# Patient Record
Sex: Female | Born: 1970 | Race: White | Hispanic: No | Marital: Married | Smoking: Former smoker
Health system: Southern US, Community
[De-identification: ages and names within clinical notes are randomized; demographics above are authoritative.]

## PROBLEM LIST (undated history)

## (undated) DIAGNOSIS — I10 Essential (primary) hypertension: Secondary | ICD-10-CM

## (undated) DIAGNOSIS — J45909 Unspecified asthma, uncomplicated: Secondary | ICD-10-CM

## (undated) HISTORY — DX: Unspecified asthma, uncomplicated: J45.909

## (undated) HISTORY — DX: Essential (primary) hypertension: I10

## (undated) HISTORY — PX: FOOT SURGERY: SHX648

---

## 1998-08-03 ENCOUNTER — Ambulatory Visit (HOSPITAL_COMMUNITY): Admission: RE | Admit: 1998-08-03 | Discharge: 1998-08-03 | Payer: Self-pay | Admitting: Family Medicine

## 1998-08-03 ENCOUNTER — Encounter: Payer: Self-pay | Admitting: Family Medicine

## 1998-11-06 ENCOUNTER — Ambulatory Visit (HOSPITAL_COMMUNITY): Admission: RE | Admit: 1998-11-06 | Discharge: 1998-11-06 | Payer: Self-pay | Admitting: Family Medicine

## 1998-11-06 ENCOUNTER — Encounter: Payer: Self-pay | Admitting: Family Medicine

## 1999-11-25 ENCOUNTER — Emergency Department (HOSPITAL_COMMUNITY): Admission: EM | Admit: 1999-11-25 | Discharge: 1999-11-25 | Payer: Self-pay | Admitting: *Deleted

## 1999-11-25 ENCOUNTER — Encounter: Payer: Self-pay | Admitting: Emergency Medicine

## 1999-12-11 ENCOUNTER — Emergency Department (HOSPITAL_COMMUNITY): Admission: EM | Admit: 1999-12-11 | Discharge: 1999-12-11 | Payer: Self-pay

## 2001-02-21 ENCOUNTER — Other Ambulatory Visit: Admission: RE | Admit: 2001-02-21 | Discharge: 2001-02-21 | Payer: Self-pay | Admitting: Obstetrics & Gynecology

## 2001-08-22 ENCOUNTER — Inpatient Hospital Stay (HOSPITAL_COMMUNITY): Admission: AD | Admit: 2001-08-22 | Discharge: 2001-08-22 | Payer: Self-pay | Admitting: Obstetrics and Gynecology

## 2001-09-12 DIAGNOSIS — O321XX0 Maternal care for breech presentation, not applicable or unspecified: Secondary | ICD-10-CM

## 2001-09-23 ENCOUNTER — Inpatient Hospital Stay (HOSPITAL_COMMUNITY): Admission: AD | Admit: 2001-09-23 | Discharge: 2001-09-26 | Payer: Self-pay | Admitting: Obstetrics and Gynecology

## 2001-09-23 ENCOUNTER — Encounter (INDEPENDENT_AMBULATORY_CARE_PROVIDER_SITE_OTHER): Payer: Self-pay | Admitting: Specialist

## 2001-09-29 ENCOUNTER — Encounter: Admission: RE | Admit: 2001-09-29 | Discharge: 2001-10-29 | Payer: Self-pay | Admitting: Obstetrics and Gynecology

## 2002-05-30 ENCOUNTER — Other Ambulatory Visit: Admission: RE | Admit: 2002-05-30 | Discharge: 2002-05-30 | Payer: Self-pay | Admitting: Gynecology

## 2003-07-11 ENCOUNTER — Other Ambulatory Visit: Admission: RE | Admit: 2003-07-11 | Discharge: 2003-07-11 | Payer: Self-pay | Admitting: Obstetrics & Gynecology

## 2004-08-02 ENCOUNTER — Other Ambulatory Visit: Admission: RE | Admit: 2004-08-02 | Discharge: 2004-08-02 | Payer: Self-pay | Admitting: Obstetrics & Gynecology

## 2005-08-24 ENCOUNTER — Other Ambulatory Visit: Admission: RE | Admit: 2005-08-24 | Discharge: 2005-08-24 | Payer: Self-pay | Admitting: Obstetrics & Gynecology

## 2005-12-16 ENCOUNTER — Emergency Department (HOSPITAL_COMMUNITY): Admission: EM | Admit: 2005-12-16 | Discharge: 2005-12-16 | Payer: Self-pay | Admitting: Emergency Medicine

## 2006-04-16 ENCOUNTER — Inpatient Hospital Stay (HOSPITAL_COMMUNITY): Admission: AD | Admit: 2006-04-16 | Discharge: 2006-04-19 | Payer: Self-pay | Admitting: Obstetrics & Gynecology

## 2006-04-16 ENCOUNTER — Encounter (INDEPENDENT_AMBULATORY_CARE_PROVIDER_SITE_OTHER): Payer: Self-pay | Admitting: *Deleted

## 2006-04-20 ENCOUNTER — Encounter: Admission: RE | Admit: 2006-04-20 | Discharge: 2006-05-12 | Payer: Self-pay | Admitting: Obstetrics & Gynecology

## 2006-05-05 ENCOUNTER — Ambulatory Visit: Admission: RE | Admit: 2006-05-05 | Discharge: 2006-05-05 | Payer: Self-pay | Admitting: Obstetrics & Gynecology

## 2006-05-12 ENCOUNTER — Ambulatory Visit: Admission: RE | Admit: 2006-05-12 | Discharge: 2006-05-12 | Payer: Self-pay | Admitting: Obstetrics & Gynecology

## 2009-12-01 ENCOUNTER — Encounter: Admission: RE | Admit: 2009-12-01 | Discharge: 2009-12-01 | Payer: Self-pay | Admitting: Family Medicine

## 2010-06-30 ENCOUNTER — Encounter: Admission: RE | Admit: 2010-06-30 | Discharge: 2010-06-30 | Payer: Self-pay | Admitting: Obstetrics & Gynecology

## 2010-10-02 ENCOUNTER — Encounter: Payer: Self-pay | Admitting: Obstetrics & Gynecology

## 2011-01-05 ENCOUNTER — Other Ambulatory Visit: Payer: Self-pay | Admitting: Podiatry

## 2011-01-05 DIAGNOSIS — M79672 Pain in left foot: Secondary | ICD-10-CM

## 2011-01-06 ENCOUNTER — Ambulatory Visit
Admission: RE | Admit: 2011-01-06 | Discharge: 2011-01-06 | Disposition: A | Discharge status: Home / Self Care | Payer: 59 | Source: Ambulatory Visit | Attending: Podiatry | Admitting: Podiatry

## 2011-01-06 DIAGNOSIS — M79672 Pain in left foot: Secondary | ICD-10-CM

## 2011-01-27 ENCOUNTER — Other Ambulatory Visit: Payer: Self-pay | Admitting: Dermatology

## 2011-01-27 ENCOUNTER — Ambulatory Visit
Admission: RE | Admit: 2011-01-27 | Discharge: 2011-01-27 | Disposition: A | Discharge status: Home / Self Care | Payer: 59 | Source: Ambulatory Visit | Attending: Dermatology | Admitting: Dermatology

## 2011-01-27 DIAGNOSIS — R1012 Left upper quadrant pain: Secondary | ICD-10-CM

## 2011-01-27 MED ORDER — IOHEXOL 300 MG/ML  SOLN
100.0000 mL | Freq: Once | INTRAMUSCULAR | Status: AC | PRN
  Administered 2011-01-27: 100 mL via INTRAVENOUS

## 2011-01-28 NOTE — Discharge Summary (Signed)
[name], [name] NO.:  [id number]   MEDICAL RECORD NO.:  [id number]          PATIENT TYPE:  INP   LOCATION:  9104                          FACILITY:  [facility]   PHYSICIAN:  Dominique Davenport, M.D.   DATE OF BIRTH:  March 05, 1971   DATE OF ADMISSION:  04/16/2006  DATE OF DISCHARGE:  04/19/2006                                 DISCHARGE SUMMARY   FINAL DIAGNOSIS:  1. Intrauterine pregnancy at term.  2. History of previous cesarean section.  The patient desires repeat      cesarean section.  3. Ruptured membranes.  4. Brief trial of labor.  5. Vulvar lesion which came back as seborrheic keratosis with features of      condylomata.   PROCEDURES:  Procedure was repeat low transverse cesarean section and  removal of seborrheic keratosis.  Surgeon:  Dr. Annamaria Helling.  Complications:  None.   This 40 year old, G4 P 1-0-2-1, presents at term with rupture of membranes.  The patient had a previous cesarean section for a breech pregnancy and was  actually desirous of a vaginal birth after cesarean section at this time.  The patient's antepartum course up to this point had been uncomplicated.  She was advanced maternal age and did have a normal first trimester screen.  She was a smoker that quit with her pregnancy and had a negative group B  strep culture obtained in the office at 36 weeks.  The patient also had a  vulvar skin lesion that she wanted removed at the time of delivery.  Upon  admission to the hospital, the patient's cervix was only a fingertip  dilated, 20% effaced,  at a minus three to minus four station.  And, when  Dr. Aldona Bar evaluated the patient in lieu of her poor Bishop score, he  discussed with the patient and her husband the pros and cons of the VBAC.  She became desirous __________ cesarean section at this time and also wanted  the removal of this vulvar lesion taken care of.  She was taken to the operating room on April 16, 2006, by Dr. Annamaria Helling,  where  a repeat low transverse cesarean section was performed with the  delivery of a 7 pound 4 ounce female infant with Apgars of 9 and 9.  A short  umbilical cord was noted upon delivery but it was a three-vessel cord. After  delivery, Dr. Aldona Bar did remove the vulvar lesion which appeared to be a  seborrheic keratosis with features of a condylomata, otherwise the procedure  went without complications.  The patient's postoperative course was benign without any significant  fevers.   She was felt ready for discharge on postoperative day #3, was sent home on a  regular diet, told to decrease activity, was given Tylox, #25, one to two  every 4 hours as needed for pain, Told she could use over-the-counter Motrin  up to 600 mg every 6 hours as needed for pain, was to continue her prenatal  vitamins, was to follow up in our office in 4 weeks.   DISCHARGE LABORATORY:  The patient had a hemoglobin  of 8.8, white blood cell  count of 10.1, platelets of 232,000.      Leilani Able, P.A.-C.      Dominique Davenport, M.D.  Electronically Signed    MB/MEDQ  D:  06/01/2006  T:  06/02/2006  Job:  [id number]

## 2011-01-28 NOTE — Op Note (Signed)
[facility]  Patient:    Dominique Davenport, Dominique Davenport Visit Number: [id number] MRN: [id number]          Service Type: OBS Location: 910A 9132 01 Attending Physician:  Miguel Aschoff Dictated by:   Miguel Aschoff, M.D. Proc. Date: 09/23/01 Admit Date:  09/23/2001                             Operative Report  PREOPERATIVE DIAGNOSES:       1. Intrauterine pregnancy at 38 weeks with                                  spontaneous rupture of membranes, breech                                  presentation.                               2. Verrucous lesion of labia majora.  POSTOPERATIVE DIAGNOSES:      1. Intrauterine pregnancy at 38 weeks with                                  spontaneous rupture of membranes, breech                                  presentation.                               2. Verrucous lesion of labia majora.                               3. Delivery of viable female infant.  Apgars                                  7 and 9 from a breech presentation.  OPERATION:                    Primary low flap transverse cesarean section.  SURGEON:                      Miguel Aschoff, M.D.  ANESTHESIA:                   Spinal.  COMPLICATIONS:                None.  JUSTIFICATION:                The patient is a 40 year old white female, gravida 3, para 0-0-2-0 at 38 weeks.  The patient was scheduled for a primary cesarean section on September 27, 2001, for breech presentation, but had spontaneous rupture of membranes on the 12th.  In view of the rupture of membranes, the persistent breech presentation, she presents now to undergo primary cesarean section.  In addition, the patient reports a verrucous lesion on her left labia, and request that this lesion be excised at the time of  cesarean section.  DESCRIPTION OF PROCEDURE:     The patient was taken to the operating room and placed in the sitting position.  Spinal anesthesia was administered without difficulty.   She was then placed in the supine position, deviated to the left, and prepped and draped in the usual sterile fashion.  After this was done, a Pfannenstiel incision was made, and extended down through the subcutaneous tissue where the bleeding points were being clamped and coagulated as encountered.  The fascia was then identified and incised transversely and separated from the underlying rectus muscles.  The rectus muscles were divided in the midline.  The peritoneum was then identified and entered, carefully avoiding underlying structures.  The bladder flap was then created and protected with a bladder blade.  An elliptical transverse incision was made into the lower uterine segment.  The amniotic cavity was entered.  Clear fluid was obtained.  At this point, the patient was delivered of a viable female infant from a frank breech presentation of the sacrum anterior with Apgar score of 7 at one minute and 9 at five minutes.  After delivery of the baby, the infant was handed to the neonatal team in attendance.  Cord bloods were obtained for appropriate studies.  The placenta was then delivered and the uterus was evacuated of any remaining products of conception.  The angles of the uterine incision were then ligated using figure-of-eight sutures of #1 Vicryl.  Then, the uterus was closed in layers. The first layer was a running interlocking suture of #1 Vicryl followed by an imbricating suture of #1 Vicryl.  The bladder flap was reapproximated using a running 2-0 Vicryl suture.  At this point, the abdomen was irrigated with warm saline.  Lap counts were taken and found to be correct, and then the abdomen was closed.  The parietal peritoneum was closed using a running continuous 3-0 Vicryl suture.  The rectus muscles were reapproximated using a running continuous 0 Vicryl suture. The fascia was closed using two sutures of 0 Vicryl, starting at the lateral fascial angles and meeting in the  midline.  The subcutaneous tissue and skin were closed using staples.  Estimated blood loss was approximately 600 cc.  The patient tolerated the procedure well and went to the recovery room in satisfactory condition. Dictated by:   Miguel Aschoff, M.D. Attending Physician:  Miguel Aschoff DD:  09/23/01 TD:  09/24/01 Job: [id number]

## 2011-01-28 NOTE — Discharge Summary (Signed)
[facility]  Patient:    HAYDN, CUSH Visit Number: [id number] MRN: [id number]          Service Type: OBS Location: 910A 9132 01 Attending Physician:  Miguel Aschoff Dictated by:   Caralyn Guile. Arlyce Dice, M.D. Admit Date:  09/23/2001 Discharge Date: 09/26/2001                             Discharge Summary  FINAL DIAGNOSES:              1. Intrauterine pregnancy at 38 weeks.                               2. Breech presentation.  SECONDARY DIAGNOSES:          None.  PROCEDURES:                   Primary low transverse cesarean section.  COMPLICATIONS:                None.  CONDITION ON DISCHARGE:       Improved.  HISTORY OF PRESENT ILLNESS:   This is a 40 year old white female, gravida 3, para 0, AB 2, who was admitted at [redacted] weeks gestation with a breech presentation.  HOSPITAL COURSE:              The patient was taken to the operating room by Miguel Aschoff, M.D., where a primary low flap cesarean section was performed without complication with delivery of a 6 pound 9 ounce female infant with Apgar scores of 7 and 9.  The patients postoperative course was benign without significant fever or anemia.  On the third postoperative day, the patient was doing well and was felt to be ready for discharge.  DIET:                         She was discharged on her regular diet.  ACTIVITY:                     Told to limit her activity.  DISCHARGE MEDICATIONS:        She was given Tylox 25 tablets to take one to two every four hours for pain and asked to take her prenatal vitamins.  FOLLOW-UP:                    She was told to return to the office in four weeks for follow-up evaluation.  LABORATORY DATA:              A benign skin tag.  This was from a skin lesion that was removed by Miguel Aschoff, M.D., at the time of cesarean section from the patients buttock.  Admission hemoglobin 11.8.  Discharge hemoglobin 10.4.  White blood cell count on discharge  11.3.  A herpes culture had been done on postpartum day #1 by Loraine Leriche E. Dareen Piano, M.D., for a vulvar lesion.  The culture at the time of this dictation was in progress for seven days and was negative at that time according to the records in the hospital. Dictated by:   Caralyn Guile. Arlyce Dice, M.D. Attending Physician:  Miguel Aschoff DD:  10/22/01 TD:  10/22/01 Job: [id number]

## 2011-01-28 NOTE — Op Note (Signed)
Dominique Davenport, ARTS             ACCOUNT NO.:  [id number]   MEDICAL RECORD NO.:  [id number]          PATIENT TYPE:  INP   LOCATION:  9104                          [facility]ACILITY:  [facility]   PHYSICIAN:  Gerrit Friends. Aldona Bar, M.D.   DATE OF BIRTH:  1971-01-01   DATE OF PROCEDURE:  04/16/2006  DATE OF DISCHARGE:                                 OPERATIVE REPORT   PREOPERATIVE DIAGNOSES:  1.  Term pregnancy.  2.  Ruptured membranes.  3.  Brief trial of labor.  4.  Previous cesarean section, desire for repeat cesarean section.  5.  Vulvar lesion.   POSTOPERATIVE DIAGNOSES:  1.  Term pregnancy.  2.  Ruptured membranes.  3.  Brief trial of labor.  4.  Previous cesarean section, desire for repeat cesarean section.  5.  Vulvar lesion.  6.  Delivery of a 7 pound 4 ounce female infant, Apgars 9 and 9.  7.  Apparent short umbilical cord (pathology pending).  8.  Pathology pending on vulvar skin lesion.   PROCEDURES:  1.  Repeat low transverse cesarean section.  2.  Removal of vulvar skin lesion.   SURGEON:  Gerrit Friends. Aldona Bar, M.D.   ANESTHESIA:  Spinal.   HISTORY:  This 40 year old gravida 4, para 1, had rupture of membranes at  approximately 2 a.m. on April 16, 2006, and had a previous cesarean section  for a breech delivery and was desirous of VBAC.  My initial examination at  approximately 8 a.m. on the morning of August 5 found her cervix to be  fingertip, maybe 20% effaced, with the vertex at -3 to -4 station.  The  patient was having some contractions.  Fetal heart rate was reactive.   In view of the patient's poor Bishop score, I was somewhat pessimistic about  the success of VBAC.  After talking to the patient and her husband and  family on several occasions and because of patient frustration with her  presentation and indeed no subsequent change after an exam 2 hours later in  spite of some contractions, she became desirous of a repeat low transverse  cesarean section and is now taken to  the OR for same.  In addition, she is  desirous of having a small vulvar lesion just to the left of the clitoris  removed.  Apparently a similar lesion was removed 4 years ago when she had  her first cesarean section.   The patient was taken to the operating room, where after the satisfactory  induction of spinal anesthetic the patient was prepped and draped in the  usual fashion, having been placed in a supine position slightly tilted to  the left.  A Foley catheter was inserted as part of the prep.   Once the patient was adequate draped and good anesthetic levels were  documented, the procedure was begun.  A Pfannenstiel incision was made  slightly above the previous incision and with minimal difficulty dissected  down to and through the fascia in a low transverse fashion with hemostasis  created at each layer.  Fascia was incised a low transverse fashion.  Subfascial  space was created, midline was identified, peritoneum entered  with care taken to avoid the bowel superiorly and the bladder inferiorly.  The bladder was elevated.  Once the vesicouterine peritoneum was identified,  it was incised in a low transverse fashion, pushed off the lower uterine  segment and then sharp incision in a low transverse fashion was then made  with the Metzenbaum scissors and extended laterally with the fingers.  Thereafter with the aid of the vacuum extractor, delivery of viable 7 pound  4 ounce female infant with Apgars of 9 and 9 was carried out without  difficulty.  The infant cried spontaneously at once and after the cord was  clamped and cut, the infant was passed to the off the awaiting team and  subsequently taken to the nursery in good condition.  Cord bloods were  collected.  Placenta was thereafter delivered intact and the cord seemed to  be very short.  The placenta was requested to be sent to pathology because  of the short cord.  The patient was also a cord blood donor, and this was   handled appropriately.   The uterus was exteriorized at this point and manually stimulated and was  slowly given intravenous Pitocin.  Uterus contracted after some uterine  atony was noted.  At this time after the uterus was rendered free of any  remaining products conception, the uterine incision was closed using a  single layer of #1 Vicryl in a running locking fashion with excellent  hemostasis.  An additional figure-of-eight of #1 Vicryl was placed in the  right left angle.   Tubes and ovaries were inspected and noted to be normal.  At this time the  uterus was well-contracted.  The abdomen was lavaged of all free blood and  clot and the uterus placed into the abdominal cavity.  All counts at this  point were noted to be correct and no foreign bodies were noted to be  remaining in the abdominal cavity.  At this point after the abdomen lavaged  of all free blood and clot, closure of the abdomen was begun in layers.  The  abdominal peritoneum was closed with 0 Vicryl in a running fashion and  muscle secured with same.  Assured of good fascial hemostasis, the fascia  was then reapproximated using 0 Vicryl from angle to midline bilaterally.  Subcutaneous tissue was rendered hemostatic and staples then used to close  the skin.  Attention at this time was turned to the vulvar lesion, which was  grasped with an Allis clamp and excised in its entirety and sent to  pathology.  Two sutures of interrupted 2-0 Vicryl were used to close the  skin and, again, a pressure dressing was applied both to this area and to  the larger abdominal incision.  The patient at this point was transported to  the recovery room in satisfactory condition, having tolerated the procedure  well.  Estimated blood loss 700 mL.  All counts correct x2.  At the  conclusion of the procedure both mother and baby were doing well in their  respective recovery areas.  In summary, this patient who originally had signed on for a  VBAC presented  with a Bishop score essentially of 0 and rupture of membranes for 6 hours.  After several hours there was no change and the patient wanting what was  best to the baby, after much discussion requested delivery by repeat low  transverse cesarean section, which was carried out without  difficulty.      Gerrit Friends. Aldona Bar, M.D.  Electronically Signed     RMW/MEDQ  D:  04/16/2006  T:  04/17/2006  Job:  [id number]

## 2011-02-16 ENCOUNTER — Encounter (HOSPITAL_BASED_OUTPATIENT_CLINIC_OR_DEPARTMENT_OTHER)
Admission: RE | Admit: 2011-02-16 | Discharge: 2011-02-16 | Disposition: A | Discharge status: Home / Self Care | Payer: 59 | Source: Ambulatory Visit | Attending: Podiatry | Admitting: Podiatry

## 2011-02-16 LAB — POCT I-STAT, CHEM 8
Calcium, Ion: 1.18 mmol/L (ref 1.12–1.32)
Chloride: 106 mEq/L (ref 96–112)
Glucose, Bld: 87 mg/dL (ref 70–99)
HCT: 39 % (ref 36.0–46.0)
Hemoglobin: 13.3 g/dL (ref 12.0–15.0)
Potassium: 4 mEq/L (ref 3.5–5.1)

## 2011-02-18 ENCOUNTER — Ambulatory Visit (HOSPITAL_BASED_OUTPATIENT_CLINIC_OR_DEPARTMENT_OTHER)
Admission: RE | Admit: 2011-02-18 | Discharge: 2011-02-18 | Disposition: A | Discharge status: Home / Self Care | Payer: 59 | Source: Ambulatory Visit | Attending: Podiatry | Admitting: Podiatry

## 2011-02-18 DIAGNOSIS — X58XXXA Exposure to other specified factors, initial encounter: Secondary | ICD-10-CM | POA: Insufficient documentation

## 2011-02-18 DIAGNOSIS — M2010 Hallux valgus (acquired), unspecified foot: Secondary | ICD-10-CM | POA: Insufficient documentation

## 2011-02-18 DIAGNOSIS — Z01812 Encounter for preprocedural laboratory examination: Secondary | ICD-10-CM | POA: Insufficient documentation

## 2011-02-18 DIAGNOSIS — S92919A Unspecified fracture of unspecified toe(s), initial encounter for closed fracture: Secondary | ICD-10-CM | POA: Insufficient documentation

## 2011-02-18 LAB — POCT HEMOGLOBIN-HEMACUE: Hemoglobin: 13.3 g/dL (ref 12.0–15.0)

## 2011-11-02 ENCOUNTER — Other Ambulatory Visit: Payer: Self-pay | Admitting: Obstetrics & Gynecology

## 2014-02-17 ENCOUNTER — Encounter: Payer: Self-pay | Admitting: *Deleted

## 2014-02-17 ENCOUNTER — Other Ambulatory Visit: Payer: Self-pay | Admitting: *Deleted

## 2014-02-17 ENCOUNTER — Encounter (INDEPENDENT_AMBULATORY_CARE_PROVIDER_SITE_OTHER): Payer: 59

## 2014-02-17 DIAGNOSIS — R002 Palpitations: Secondary | ICD-10-CM

## 2014-02-17 NOTE — Progress Notes (Signed)
Patient ID: Dominique Davenport, female   DOB: 07/30/1971, 43 y.o.   MRN: [id number] E-Cardio verite 30 day cardiac event monitor applied to patient.

## 2014-07-07 ENCOUNTER — Other Ambulatory Visit: Payer: Self-pay | Admitting: Obstetrics & Gynecology

## 2014-07-08 LAB — CYTOLOGY - PAP

## 2015-02-17 ENCOUNTER — Ambulatory Visit: Payer: Self-pay | Admitting: Podiatry

## 2015-02-19 ENCOUNTER — Encounter: Payer: Self-pay | Admitting: Podiatry

## 2015-02-19 ENCOUNTER — Ambulatory Visit (INDEPENDENT_AMBULATORY_CARE_PROVIDER_SITE_OTHER): Payer: 59 | Admitting: Podiatry

## 2015-02-19 ENCOUNTER — Ambulatory Visit (INDEPENDENT_AMBULATORY_CARE_PROVIDER_SITE_OTHER): Payer: 59

## 2015-02-19 VITALS — BP 145/81 | HR 73 | Resp 16

## 2015-02-19 DIAGNOSIS — M779 Enthesopathy, unspecified: Secondary | ICD-10-CM | POA: Diagnosis not present

## 2015-02-19 DIAGNOSIS — S92912A Unspecified fracture of left toe(s), initial encounter for closed fracture: Secondary | ICD-10-CM

## 2015-02-19 DIAGNOSIS — D3610 Benign neoplasm of peripheral nerves and autonomic nervous system, unspecified: Secondary | ICD-10-CM

## 2015-02-19 NOTE — Progress Notes (Signed)
She presents today with a chief complaint of pain to her third toe left foot where she stubbed her toe approximately one week ago. She states it was black and blue and demonstrates that to me whether photographed. She's also complaining of a painful nodule to the plantar medial aspect of the right foot. Around the first metatarsophalangeal joint she has pain on ambulation with 1 particular pair of shoes. She denies any changes in her past medical history medications or allergies.  Objective: Vital signs are stable she is alert and oriented 3. Pulses are palpable right. Pulses are palpable left. Third toe does demonstrate ecchymosis and edema when radiograph does demonstrate fracture nondisplaced proximal phalanx as well as medial condyle to the distal phalanx third digit left foot. These are nondisplaced non-comminuted fractures and should go on to heal. Right foot does demonstrate plantar medial nerve first metatarsophalangeal joint right foot consistent with Joplin's neuroma.  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 I injected dexamethasone plantar medial digital nerve right. Follow-up with her in 3 weeks if necessary.

## 2015-03-17 ENCOUNTER — Ambulatory Visit: Payer: 59 | Admitting: Podiatry

## 2016-02-15 ENCOUNTER — Other Ambulatory Visit: Payer: Self-pay | Admitting: Obstetrics & Gynecology

## 2016-02-16 LAB — CYTOLOGY - PAP

## 2016-11-24 ENCOUNTER — Ambulatory Visit (INDEPENDENT_AMBULATORY_CARE_PROVIDER_SITE_OTHER): Payer: 59

## 2016-11-24 ENCOUNTER — Encounter: Payer: Self-pay | Admitting: Podiatry

## 2016-11-24 ENCOUNTER — Ambulatory Visit (INDEPENDENT_AMBULATORY_CARE_PROVIDER_SITE_OTHER): Payer: 59 | Admitting: Podiatry

## 2016-11-24 DIAGNOSIS — S92411A Displaced fracture of proximal phalanx of right great toe, initial encounter for closed fracture: Secondary | ICD-10-CM

## 2016-11-24 DIAGNOSIS — M7750 Other enthesopathy of unspecified foot: Secondary | ICD-10-CM

## 2016-11-24 DIAGNOSIS — Q828 Other specified congenital malformations of skin: Secondary | ICD-10-CM

## 2016-11-24 DIAGNOSIS — M778 Other enthesopathies, not elsewhere classified: Secondary | ICD-10-CM

## 2016-11-24 DIAGNOSIS — M779 Enthesopathy, unspecified: Secondary | ICD-10-CM

## 2016-11-24 NOTE — Progress Notes (Signed)
She presents today with a concern of her fracture to the right hallux. She states that she stepped off of a step wrong. She is also complaining of painful lesions beneath the second metatarsophalangeal joints and painful joints.  Objective: Vital signs are stable she is alert and oriented 3. Pulses are strong and palpable. She has pain on end range of motion of the second metatarsophalangeal joint bilateral and tenderness on palpation of the right hallux. Radiographs taken today do not demonstrate any fracture to the right hallux or to the lesser metatarsals. For keratomas are present on physical exam plantar aspect of the foot.  Assessment: Porokeratosis bilateral. Capsulitis second metatarsophalangeal joint. Probable capsulitis of the first metatarsophalangeal joint right.  Plan: I recommended she continue to wear the Cam Walker on the right foot. I reinjected the second metatarsophalangeal joints bilaterally. I also debrided all reactive hyperkeratosis.

## 2016-12-08 ENCOUNTER — Ambulatory Visit: Payer: 59 | Admitting: Podiatry

## 2016-12-22 ENCOUNTER — Ambulatory Visit (INDEPENDENT_AMBULATORY_CARE_PROVIDER_SITE_OTHER): Payer: 59 | Admitting: Podiatry

## 2016-12-22 DIAGNOSIS — M7750 Other enthesopathy of unspecified foot: Secondary | ICD-10-CM

## 2016-12-22 DIAGNOSIS — D3610 Benign neoplasm of peripheral nerves and autonomic nervous system, unspecified: Secondary | ICD-10-CM

## 2016-12-22 DIAGNOSIS — M779 Enthesopathy, unspecified: Principal | ICD-10-CM

## 2016-12-22 DIAGNOSIS — M778 Other enthesopathies, not elsewhere classified: Secondary | ICD-10-CM

## 2016-12-25 NOTE — Progress Notes (Signed)
She presents today for follow-up of continuous pain to the first metatarsophalangeal joint. She states that this right first metatarsophalangeal joint has been extremely painful and states that she cannot go without the Cam Walker or the Darco shoe. She states it is painful on palpation and ambulation.  Objective: No change in physical exam still swollen still painful there appears to be a palpable nerve or neuroma plantar medial aspect consistent with a Joplin's neuroma. With the entire joint itself hurts.  Assessment: Capsulitis can rule out fracture can rule out osteoarthritic changes and possible Joplin's neuroma.  Plan: Recommended MRI for surgical consideration of this right foot.

## 2017-01-18 ENCOUNTER — Inpatient Hospital Stay: Admission: RE | Admit: 2017-01-18 | Payer: 59 | Source: Ambulatory Visit

## 2017-01-31 ENCOUNTER — Other Ambulatory Visit: Payer: 59

## 2017-01-31 ENCOUNTER — Telehealth: Payer: Self-pay | Admitting: Podiatry

## 2017-01-31 NOTE — Telephone Encounter (Signed)
Request a call back in regards to the work order for her MRI as she has some insurance issues going on. Please call me on my mobile number [phone].

## 2017-01-31 NOTE — Telephone Encounter (Signed)
Patient states she had to cancel her appt at [facility] - her insurance requires her to go through scheduling with her insurance company. Patient requested to order for the MRI be emailed to her so she could send it in. I had Bethann Humble email the info to her.

## 2017-02-22 ENCOUNTER — Telehealth: Payer: Self-pay | Admitting: *Deleted

## 2017-02-22 NOTE — Telephone Encounter (Addendum)
Faxed copy of MRI orders from 12/23/2016 to [facility], [address] [place], [place] [address], [phone]/28/2017-I ordered copy of pt's MRI disc from [facility] to be mailed to the [place] [facility] office. Left message informing pt Dr. Milinda Pointer had reviewed the results of the MRI and was sending the MRI disc copy to a radiology specialist for a more indepth review for more information to treat, there would be a delay of 7-10 days and we would call with instructions once the overread results returned.03/03/2017-Mailed copy of MRI disc from [facility] to [facility].

## 2017-03-23 ENCOUNTER — Encounter: Payer: Self-pay | Admitting: Podiatry

## 2017-04-18 ENCOUNTER — Encounter: Payer: Self-pay | Admitting: Podiatry

## 2017-04-18 ENCOUNTER — Ambulatory Visit (INDEPENDENT_AMBULATORY_CARE_PROVIDER_SITE_OTHER): Payer: 59 | Admitting: Podiatry

## 2017-04-18 DIAGNOSIS — M778 Other enthesopathies, not elsewhere classified: Secondary | ICD-10-CM

## 2017-04-18 DIAGNOSIS — M779 Enthesopathy, unspecified: Principal | ICD-10-CM

## 2017-04-18 DIAGNOSIS — M7750 Other enthesopathy of unspecified foot: Secondary | ICD-10-CM

## 2017-04-19 NOTE — Progress Notes (Signed)
She presents today for follow-up of first metatarsophalangeal joint capsulitis states that seems to be doing better than it wasn't she's been wearing her Darco shoe.  Objective: She has no reproducible pain on palpation other than pain on palpation of the tibial sesamoid. She has good range of motion of the first metatarsophalangeal joint. MRI taken does not demonstrate any type of abnormality in the area.  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  Plan: She'll follow-up with me on an as-needed basis. She states that she feels that she can tolerate this without complications.

## 2018-05-08 ENCOUNTER — Other Ambulatory Visit: Payer: Self-pay | Admitting: Obstetrics & Gynecology

## 2018-05-08 DIAGNOSIS — N644 Mastodynia: Secondary | ICD-10-CM

## 2018-05-11 ENCOUNTER — Ambulatory Visit
Admission: RE | Admit: 2018-05-11 | Discharge: 2018-05-11 | Disposition: A | Discharge status: Home / Self Care | Payer: 59 | Source: Ambulatory Visit | Attending: Obstetrics & Gynecology | Admitting: Obstetrics & Gynecology

## 2018-05-11 DIAGNOSIS — N644 Mastodynia: Secondary | ICD-10-CM

## 2019-12-17 IMAGING — US ULTRASOUND LEFT BREAST LIMITED
1 series · 3 of 3 positions shown · non-contrast
Comparison: Previous exam(s).

ADDENDUM:
Recommend annual screening mammography.
CLINICAL DATA: The patient presented to her physician for breast
pain and slight skin erythema recently. She has been treated with
Keflex and is now on doxycycline. She recently had fever which has
resolved. The patient points out very slight redness today which I
cannot definitely identify. No palpable lump.

EXAM:
DIGITAL DIAGNOSTIC LEFT MAMMOGRAM WITH CAD AND TOMO
ULTRASOUND LEFT BREAST

[Series 1: ultrasound left breast limited · 0.09mm/px · 3 of 3 slices shown]
[im 1/3]
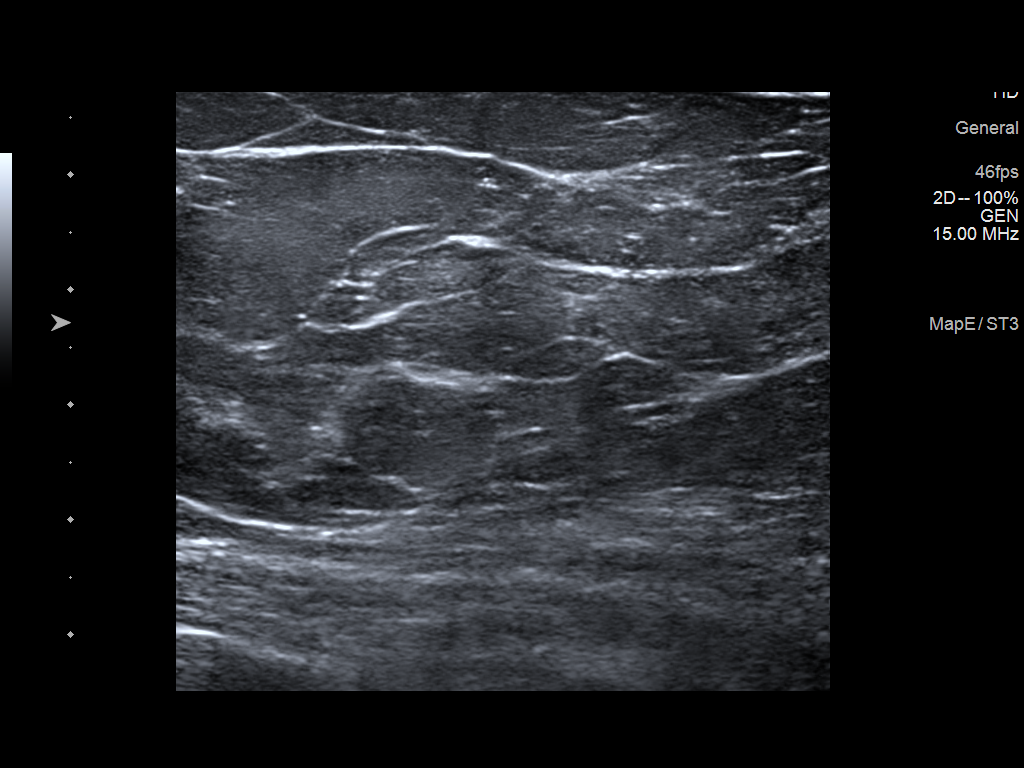
[im 2/3]
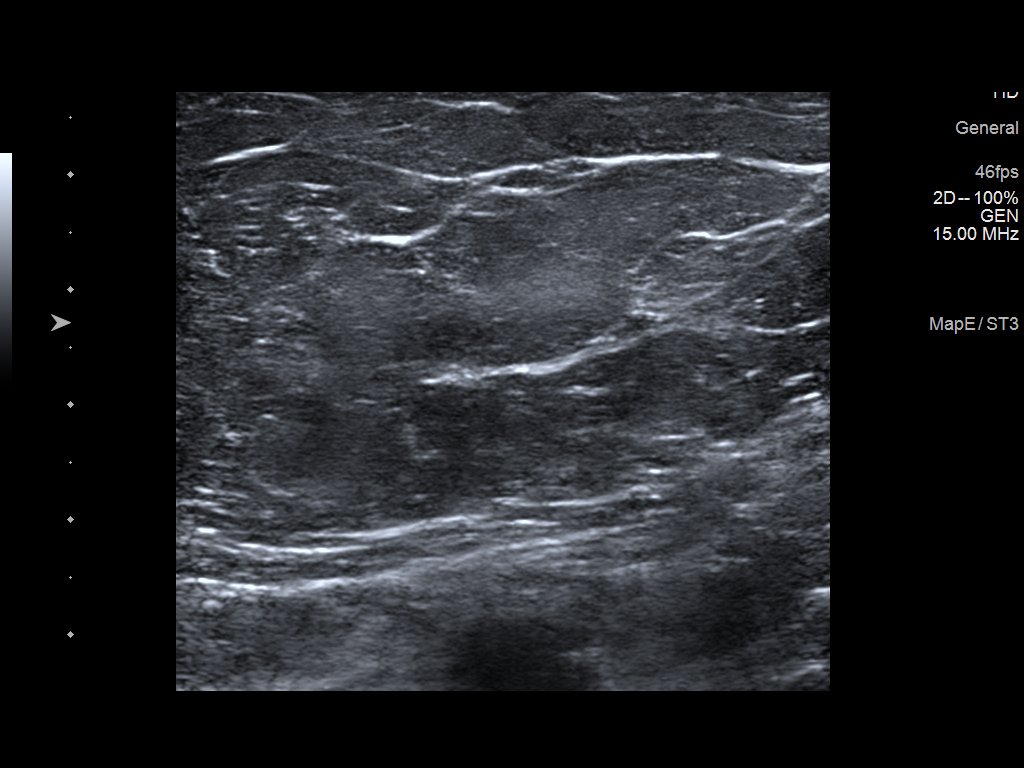
[im 3/3]
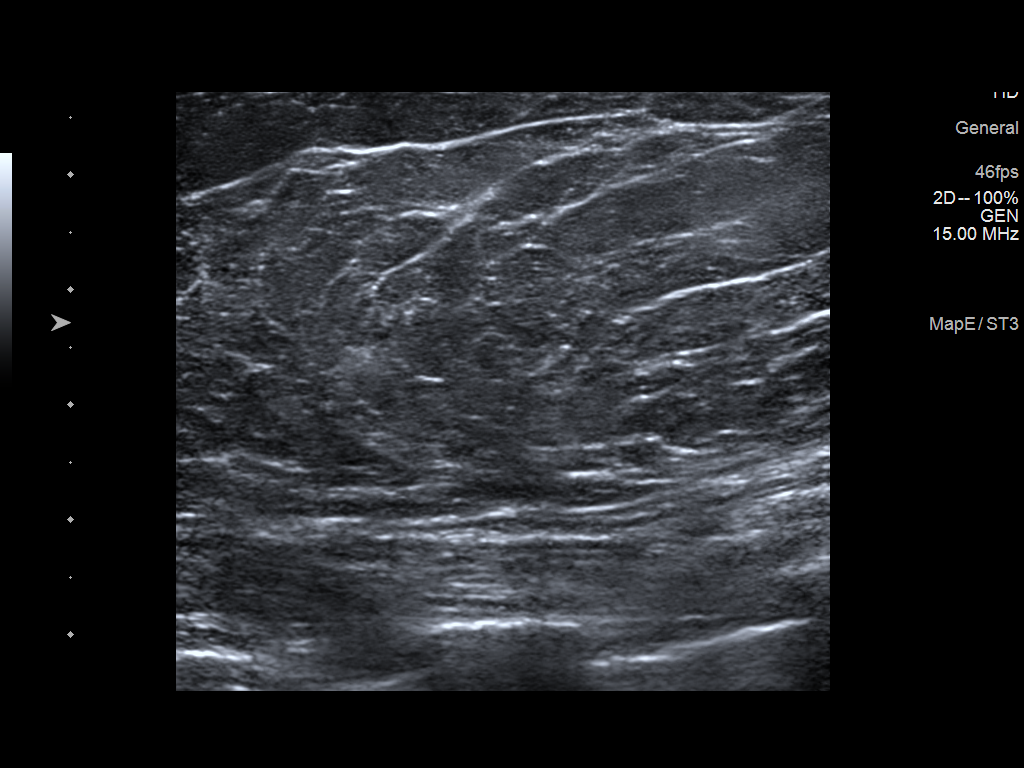

[3 of 3 positions shown; findings below may reference images not displayed]

ACR Breast Density Category b: There are scattered areas of
fibroglandular density.
FINDINGS: No suspicious masses, calcifications, distortion, or skin thickening
identified.

Mammographic images were processed with CAD.

On physical exam, no skin thickening or discrete erythema
identified.

Targeted ultrasound is performed, showing no sonographic
abnormalities.
IMPRESSION: No mammographic or sonographic evidence of malignancy.

RECOMMENDATION:
Treatment of the patient's symptoms should be based on clinical and
physical exam given lack of imaging findings. No discrete erythema
was seen today. If erythema recurs following antibiotics, recommend
surgical consultation with a punch biopsy.

I have discussed the findings and recommendations with the patient.
Results were also provided in writing at the conclusion of the
visit. If applicable, a reminder letter will be sent to the patient
regarding the next appointment.

BI-RADS CATEGORY  2: Benign.

## 2020-07-17 DIAGNOSIS — M25552 Pain in left hip: Secondary | ICD-10-CM | POA: Insufficient documentation

## 2020-07-17 DIAGNOSIS — M25562 Pain in left knee: Secondary | ICD-10-CM | POA: Insufficient documentation

## 2020-08-17 ENCOUNTER — Other Ambulatory Visit: Payer: Self-pay | Admitting: Podiatry

## 2020-08-17 ENCOUNTER — Encounter: Payer: Self-pay | Admitting: Podiatry

## 2020-08-17 ENCOUNTER — Ambulatory Visit (INDEPENDENT_AMBULATORY_CARE_PROVIDER_SITE_OTHER): Payer: 59

## 2020-08-17 ENCOUNTER — Ambulatory Visit: Payer: 59 | Admitting: Podiatry

## 2020-08-17 ENCOUNTER — Other Ambulatory Visit: Payer: Self-pay

## 2020-08-17 DIAGNOSIS — G5761 Lesion of plantar nerve, right lower limb: Secondary | ICD-10-CM

## 2020-08-17 DIAGNOSIS — G5781 Other specified mononeuropathies of right lower limb: Secondary | ICD-10-CM

## 2020-08-17 DIAGNOSIS — IMO0002 Reserved for concepts with insufficient information to code with codable children: Secondary | ICD-10-CM | POA: Insufficient documentation

## 2020-08-17 DIAGNOSIS — M778 Other enthesopathies, not elsewhere classified: Secondary | ICD-10-CM

## 2020-08-17 MED ORDER — TRIAMCINOLONE ACETONIDE 40 MG/ML IJ SUSP
20.0000 mg | Freq: Once | INTRAMUSCULAR | Status: AC
  Administered 2020-08-17: 20 mg

## 2020-08-17 NOTE — Progress Notes (Signed)
She presents today chief complaint of pain to the dorsal lateral aspect of the right foot.  States been aching for about a month or so she is concerned about a stress fracture of the right foot.  Objective: Vital signs are stable alert and oriented x3.  Pulses are palpable.  She has a painful area on palpation to the third interdigital space of the right foot consistent with neuroma.  Radiographs do not demonstrate any kind of fracture.  Assessment: Morton's neuroma third interspace right foot.  Plan: After thorough discussion we injected the area today with 20 mg Kenalog 5 mg Marcaine.  Tolerated procedure well I will follow-up with her in a few weeks.

## 2020-09-28 ENCOUNTER — Ambulatory Visit: Payer: 59 | Admitting: Podiatry

## 2020-10-07 ENCOUNTER — Ambulatory Visit (INDEPENDENT_AMBULATORY_CARE_PROVIDER_SITE_OTHER): Payer: 59 | Admitting: Podiatry

## 2020-10-07 ENCOUNTER — Other Ambulatory Visit: Payer: Self-pay

## 2020-10-07 ENCOUNTER — Encounter: Payer: Self-pay | Admitting: Podiatry

## 2020-10-07 DIAGNOSIS — G5761 Lesion of plantar nerve, right lower limb: Secondary | ICD-10-CM | POA: Diagnosis not present

## 2020-10-07 DIAGNOSIS — G5781 Other specified mononeuropathies of right lower limb: Secondary | ICD-10-CM

## 2020-10-07 MED ORDER — TRIAMCINOLONE ACETONIDE 40 MG/ML IJ SUSP
20.0000 mg | Freq: Once | INTRAMUSCULAR | Status: AC
  Administered 2020-10-07: 20 mg

## 2020-10-07 NOTE — Progress Notes (Signed)
She presents today states that her neuroma still twinges a little bit with the majority of the pain around the fifth metatarsal of the right foot but all in all is 75% better since last time she was in.  Objective: Vital signs stable alert oriented x3 she still has some tenderness on palpation of the fifth metatarsal but the majority of the tenderness is between the third and fourth metatarsal heads with a palpable Mulder's click right foot.  Assessment: Resolving neuroma 75% some lateral compensation resulting in fifth metatarsal pain.  Plan: At this point I went ahead and injected the third intermetatarsal space once again with only about 5 mg of Kenalog and 5 mg of Marcaine.  Follow-up with her in about 2 months if necessary sooner if needed.

## 2020-10-16 ENCOUNTER — Ambulatory Visit: Payer: 59 | Admitting: Neurology

## 2020-12-07 ENCOUNTER — Ambulatory Visit: Payer: 59 | Admitting: Podiatry

## 2021-12-30 DIAGNOSIS — H1013 Acute atopic conjunctivitis, bilateral: Secondary | ICD-10-CM | POA: Diagnosis not present

## 2022-02-04 DIAGNOSIS — I7300 Raynaud's syndrome without gangrene: Secondary | ICD-10-CM | POA: Diagnosis not present

## 2022-02-04 DIAGNOSIS — Z0000 Encounter for general adult medical examination without abnormal findings: Secondary | ICD-10-CM | POA: Diagnosis not present

## 2022-02-04 DIAGNOSIS — I10 Essential (primary) hypertension: Secondary | ICD-10-CM | POA: Diagnosis not present

## 2022-03-11 DIAGNOSIS — J069 Acute upper respiratory infection, unspecified: Secondary | ICD-10-CM | POA: Diagnosis not present

## 2022-03-11 DIAGNOSIS — H66001 Acute suppurative otitis media without spontaneous rupture of ear drum, right ear: Secondary | ICD-10-CM | POA: Diagnosis not present

## 2022-03-11 DIAGNOSIS — J45901 Unspecified asthma with (acute) exacerbation: Secondary | ICD-10-CM | POA: Diagnosis not present

## 2022-03-28 DIAGNOSIS — J301 Allergic rhinitis due to pollen: Secondary | ICD-10-CM | POA: Diagnosis not present

## 2022-03-28 DIAGNOSIS — J4520 Mild intermittent asthma, uncomplicated: Secondary | ICD-10-CM | POA: Diagnosis not present

## 2023-03-02 DIAGNOSIS — H903 Sensorineural hearing loss, bilateral: Secondary | ICD-10-CM | POA: Diagnosis not present

## 2023-03-02 DIAGNOSIS — H9311 Tinnitus, right ear: Secondary | ICD-10-CM | POA: Diagnosis not present

## 2023-03-03 ENCOUNTER — Other Ambulatory Visit: Payer: Self-pay | Admitting: Otolaryngology

## 2023-03-03 DIAGNOSIS — H9311 Tinnitus, right ear: Secondary | ICD-10-CM

## 2023-03-03 DIAGNOSIS — H903 Sensorineural hearing loss, bilateral: Secondary | ICD-10-CM

## 2023-03-28 DIAGNOSIS — N951 Menopausal and female climacteric states: Secondary | ICD-10-CM | POA: Diagnosis not present

## 2023-03-28 DIAGNOSIS — Z0000 Encounter for general adult medical examination without abnormal findings: Secondary | ICD-10-CM | POA: Diagnosis not present

## 2023-03-28 DIAGNOSIS — Z6833 Body mass index (BMI) 33.0-33.9, adult: Secondary | ICD-10-CM | POA: Diagnosis not present

## 2023-03-28 DIAGNOSIS — Z1231 Encounter for screening mammogram for malignant neoplasm of breast: Secondary | ICD-10-CM | POA: Diagnosis not present

## 2023-03-28 DIAGNOSIS — Z124 Encounter for screening for malignant neoplasm of cervix: Secondary | ICD-10-CM | POA: Diagnosis not present

## 2023-03-28 DIAGNOSIS — Z01419 Encounter for gynecological examination (general) (routine) without abnormal findings: Secondary | ICD-10-CM | POA: Diagnosis not present

## 2023-03-29 ENCOUNTER — Ambulatory Visit
Admission: RE | Admit: 2023-03-29 | Discharge: 2023-03-29 | Disposition: A | Discharge status: Home / Self Care | Payer: BC Managed Care – PPO | Source: Ambulatory Visit | Attending: Otolaryngology | Admitting: Otolaryngology

## 2023-03-29 DIAGNOSIS — H9311 Tinnitus, right ear: Secondary | ICD-10-CM | POA: Diagnosis not present

## 2023-03-29 DIAGNOSIS — H903 Sensorineural hearing loss, bilateral: Secondary | ICD-10-CM | POA: Diagnosis not present

## 2023-03-29 MED ORDER — GADOPICLENOL 0.5 MMOL/ML IV SOLN
8.0000 mL | Freq: Once | INTRAVENOUS | Status: AC | PRN
  Administered 2023-03-29: 8 mL via INTRAVENOUS

## 2023-04-07 DIAGNOSIS — H9311 Tinnitus, right ear: Secondary | ICD-10-CM | POA: Diagnosis not present

## 2023-04-13 DIAGNOSIS — H903 Sensorineural hearing loss, bilateral: Secondary | ICD-10-CM | POA: Diagnosis not present

## 2023-04-13 DIAGNOSIS — H9311 Tinnitus, right ear: Secondary | ICD-10-CM | POA: Diagnosis not present

## 2023-05-08 ENCOUNTER — Encounter (HOSPITAL_COMMUNITY): Payer: Self-pay | Admitting: Emergency Medicine

## 2023-05-08 ENCOUNTER — Other Ambulatory Visit: Payer: Self-pay

## 2023-05-08 ENCOUNTER — Emergency Department (HOSPITAL_COMMUNITY)
Admission: EM | Admit: 2023-05-08 | Discharge: 2023-05-09 | Disposition: A | Discharge status: Home / Self Care | Payer: BC Managed Care – PPO | Attending: Emergency Medicine | Admitting: Emergency Medicine

## 2023-05-08 DIAGNOSIS — N951 Menopausal and female climacteric states: Secondary | ICD-10-CM | POA: Diagnosis not present

## 2023-05-08 DIAGNOSIS — K429 Umbilical hernia without obstruction or gangrene: Secondary | ICD-10-CM | POA: Diagnosis not present

## 2023-05-08 DIAGNOSIS — N200 Calculus of kidney: Secondary | ICD-10-CM | POA: Diagnosis not present

## 2023-05-08 DIAGNOSIS — R1031 Right lower quadrant pain: Secondary | ICD-10-CM | POA: Diagnosis not present

## 2023-05-08 DIAGNOSIS — D72829 Elevated white blood cell count, unspecified: Secondary | ICD-10-CM | POA: Diagnosis not present

## 2023-05-08 DIAGNOSIS — N12 Tubulo-interstitial nephritis, not specified as acute or chronic: Secondary | ICD-10-CM | POA: Diagnosis not present

## 2023-05-08 DIAGNOSIS — R102 Pelvic and perineal pain: Secondary | ICD-10-CM | POA: Diagnosis not present

## 2023-05-08 NOTE — ED Triage Notes (Signed)
Patient reports suprapubic pain that started Saturday.  Patient started taking cranberry supplements and said the pain went away.  Patient states that tonight, she started having severe pain in her RLQ radiating into her side and back.  Patient denies pain with urination.

## 2023-05-09 ENCOUNTER — Emergency Department (HOSPITAL_COMMUNITY): Payer: BC Managed Care – PPO

## 2023-05-09 DIAGNOSIS — R102 Pelvic and perineal pain: Secondary | ICD-10-CM | POA: Diagnosis not present

## 2023-05-09 DIAGNOSIS — N200 Calculus of kidney: Secondary | ICD-10-CM | POA: Diagnosis not present

## 2023-05-09 DIAGNOSIS — K429 Umbilical hernia without obstruction or gangrene: Secondary | ICD-10-CM | POA: Diagnosis not present

## 2023-05-09 DIAGNOSIS — R1031 Right lower quadrant pain: Secondary | ICD-10-CM | POA: Diagnosis not present

## 2023-05-09 LAB — COMPREHENSIVE METABOLIC PANEL
ALT: 30 U/L (ref 0–44)
AST: 30 U/L (ref 15–41)
Albumin: 3.9 g/dL (ref 3.5–5.0)
Alkaline Phosphatase: 92 U/L (ref 38–126)
Anion gap: 12 (ref 5–15)
BUN: 16 mg/dL (ref 6–20)
CO2: 25 mmol/L (ref 22–32)
Calcium: 9.6 mg/dL (ref 8.9–10.3)
Chloride: 104 mmol/L (ref 98–111)
Creatinine, Ser: 1.31 mg/dL — ABNORMAL HIGH (ref 0.44–1.00)
GFR, Estimated: 49 mL/min — ABNORMAL LOW (ref >60)
Glucose, Bld: 127 mg/dL — ABNORMAL HIGH (ref 70–99)
Potassium: 4.5 mmol/L (ref 3.5–5.1)
Sodium: 141 mmol/L (ref 135–145)
Total Bilirubin: 0.5 mg/dL (ref 0.3–1.2)
Total Protein: 7.4 g/dL (ref 6.5–8.1)

## 2023-05-09 LAB — URINALYSIS, ROUTINE W REFLEX MICROSCOPIC
Bacteria, UA: NONE SEEN
Bilirubin Urine: NEGATIVE
Glucose, UA: NEGATIVE mg/dL
Ketones, ur: NEGATIVE mg/dL
Nitrite: NEGATIVE
Protein, ur: 30 mg/dL — AB
Specific Gravity, Urine: 1.008 (ref 1.005–1.030)
WBC, UA: >50 WBC/hpf (ref 0–5)
pH: 7 (ref 5.0–8.0)

## 2023-05-09 LAB — CBC WITH DIFFERENTIAL/PLATELET
Abs Immature Granulocytes: 0.13 10*3/uL — ABNORMAL HIGH (ref 0.00–0.07)
Basophils Absolute: 0.1 10*3/uL (ref 0.0–0.1)
Basophils Relative: 1 %
Eosinophils Absolute: 0.4 10*3/uL (ref 0.0–0.5)
Eosinophils Relative: 3 %
HCT: 42.4 % (ref 36.0–46.0)
Hemoglobin: 14 g/dL (ref 12.0–15.0)
Immature Granulocytes: 1 %
Lymphocytes Relative: 24 %
Lymphs Abs: 3.1 10*3/uL (ref 0.7–4.0)
MCH: 29.7 pg (ref 26.0–34.0)
MCHC: 33 g/dL (ref 30.0–36.0)
MCV: 89.8 fL (ref 80.0–100.0)
Monocytes Absolute: 1.1 10*3/uL — ABNORMAL HIGH (ref 0.1–1.0)
Monocytes Relative: 9 %
Neutro Abs: 7.8 10*3/uL — ABNORMAL HIGH (ref 1.7–7.7)
Neutrophils Relative %: 62 %
Platelets: 275 10*3/uL (ref 150–400)
RBC: 4.72 MIL/uL (ref 3.87–5.11)
RDW: 13.3 % (ref 11.5–15.5)
WBC: 12.5 10*3/uL — ABNORMAL HIGH (ref 4.0–10.5)
nRBC: 0 % (ref 0.0–0.2)

## 2023-05-09 LAB — PREGNANCY, URINE: Preg Test, Ur: NEGATIVE

## 2023-05-09 MED ORDER — HYDROCODONE-ACETAMINOPHEN 5-325 MG PO TABS: 1.0000 | ORAL_TABLET | Freq: Four times a day (QID) | ORAL | 0 refills | Status: DC | PRN

## 2023-05-09 MED ORDER — SODIUM CHLORIDE 0.9 % IV SOLN
1.0000 g | Freq: Once | INTRAVENOUS | Status: AC
  Administered 2023-05-09: 1 g via INTRAVENOUS
  Filled 2023-05-09: qty 10

## 2023-05-09 MED ORDER — ONDANSETRON HCL 4 MG/2ML IJ SOLN
4.0000 mg | Freq: Once | INTRAMUSCULAR | Status: AC
  Administered 2023-05-09: 4 mg via INTRAVENOUS
  Filled 2023-05-09: qty 2

## 2023-05-09 MED ORDER — CEPHALEXIN 500 MG PO CAPS: 500.0000 mg | ORAL_CAPSULE | Freq: Four times a day (QID) | ORAL | 0 refills | Status: DC

## 2023-05-09 MED ORDER — KETOROLAC TROMETHAMINE 15 MG/ML IJ SOLN
15.0000 mg | Freq: Once | INTRAMUSCULAR | Status: AC
  Administered 2023-05-09: 15 mg via INTRAVENOUS
  Filled 2023-05-09: qty 1

## 2023-05-09 MED ORDER — FLUCONAZOLE 150 MG PO TABS: 150.0000 mg | ORAL_TABLET | Freq: Every day | ORAL | 0 refills | Status: DC

## 2023-05-09 NOTE — ED Provider Notes (Signed)
[facility] AT [facility] Provider Note   CSN: [id number] Arrival date & time: 05/08/23  2346     History  Chief Complaint  Patient presents with   Abdominal Pain    Dominique Davenport is a 52 y.o. female.  The history is provided by the spouse and the patient.  Abdominal Pain Pain location:  RLQ Pain quality: sharp   Pain radiates to:  R flank Pain severity:  Severe Onset quality:  Gradual Duration:  5 hours Timing:  Constant Progression:  Worsening Chronicity:  New Relieved by:  Nothing Associated symptoms: nausea   Associated symptoms: no diarrhea, no fever and no hematuria   Risk factors: has not had multiple surgeries   Pt reports RLQ cramping that worsened into sharp like pain that moves into her right flank She has never had this before Only previous ABD surgeries is c-section     Home Medications Prior to Admission medications   Medication Sig Start Date End Date Taking? Authorizing Provider  carvedilol (COREG) 12.5 MG tablet Take 6.25 mg by mouth 2 (two) times daily with a meal.   Yes [provider]  cephALEXin (KEFLEX) 500 MG capsule Take 1 capsule (500 mg total) by mouth 4 (four) times daily. 05/09/23  Yes Zadie Rhine, MD  fluconazole (DIFLUCAN) 150 MG tablet Take 1 tablet (150 mg total) by mouth daily. 05/09/23  Yes Zadie Rhine, MD  HYDROcodone-acetaminophen (NORCO/VICODIN) 5-325 MG tablet Take 1 tablet by mouth every 6 (six) hours as needed for severe pain. 05/09/23  Yes Zadie Rhine, MD  levonorgestrel-ethinyl estradiol (SEASONALE) 0.15-0.03 MG tablet Take 1 tablet by mouth daily. 06/19/20  Yes [provider]  Nutritional Supplements (ADULT NUTRITIONAL SUPPLEMENT PO) Take 1 tablet by mouth daily.   Yes [provider]  oxybutynin (DITROPAN XL) 15 MG 24 hr tablet Take 15 mg by mouth daily.   Yes [provider]  Probiotic Product (PROBIOTIC PO) Take 1 capsule by mouth daily.    Yes [provider]      Allergies    Prednisone    Review of Systems   Review of Systems  Constitutional:  Negative for fever.  Gastrointestinal:  Positive for abdominal pain and nausea. Negative for diarrhea.  Genitourinary:  Negative for hematuria.    Physical Exam Updated Vital Signs BP (!) 176/113 (BP Location: Right Arm)   Pulse (!) 107   Temp 99.1 F (37.3 C)   Resp 20   Ht 1.575 m (5\' 2" )   Wt 81.6 kg   LMP  (LMP Unknown)   SpO2 98%   BMI 32.92 kg/m  Physical Exam CONSTITUTIONAL: Well developed/well nourished, anxious and uncomfortable appearing HEAD: Normocephalic/atraumatic ENMT: Mucous membranes moist NECK: supple no meningeal signs SPINE/BACK:entire spine nontender CV: S1/S2 noted, no murmurs/rubs/gallops noted LUNGS: Lungs are clear to auscultation bilaterally, no apparent distress ABDOMEN: soft, nontender, no rebound or guarding, bowel sounds noted throughout abdomen WU:[id number] cva tenderness NEURO: Pt is awake/alert/appropriate, moves all extremitiesx4.  No facial droop.   EXTREMITIES: pulses normal/equal, full ROM SKIN: warm, color normal PSYCH: anxious and tearful  ED Results / Procedures / Treatments   Labs (all labs ordered are listed, but only abnormal results are displayed) Labs Reviewed  CBC WITH DIFFERENTIAL/PLATELET - Abnormal; Notable for the following components:      Result Value   WBC 12.5 (*)    Neutro Abs 7.8 (*)    Monocytes Absolute 1.1 (*)    Abs Immature Granulocytes 0.13 (*)  All other components within normal limits  COMPREHENSIVE METABOLIC PANEL - Abnormal; Notable for the following components:   Glucose, Bld 127 (*)    Creatinine, Ser 1.31 (*)    GFR, Estimated 49 (*)    All other components within normal limits  URINALYSIS, ROUTINE W REFLEX MICROSCOPIC - Abnormal; Notable for the following components:   APPearance HAZY (*)    Hgb urine dipstick MODERATE (*)    Protein, ur 30 (*)    Leukocytes,Ua LARGE (*)     All other components within normal limits  URINE CULTURE  PREGNANCY, URINE    EKG None  Radiology CT Renal Stone Study  Result Date: 05/09/2023 CLINICAL DATA:  Suprapubic pain.  Severe right lower quadrant pain. EXAM: CT ABDOMEN AND PELVIS WITHOUT CONTRAST TECHNIQUE: Multidetector CT imaging of the abdomen and pelvis was performed following the standard protocol without IV contrast. RADIATION DOSE REDUCTION: This exam was performed according to the departmental dose-optimization program which includes automated exposure control, adjustment of the mA and/or kV according to patient size and/or use of iterative reconstruction technique. COMPARISON:  01/27/2011. FINDINGS: Lower chest: No acute abnormality. Hepatobiliary: No focal liver abnormality is seen. No gallstones, gallbladder wall thickening, or biliary dilatation. Pancreas: Unremarkable. No pancreatic ductal dilatation or surrounding inflammatory changes. Spleen: Normal in size without focal abnormality. Adrenals/Urinary Tract: No adrenal nodule or mass. There are nonobstructive renal calculi bilaterally. No ureteral calculus or obstructive uropathy bilaterally. There is mild prominence of the right ureter with periureteral fat stranding. The bladder is unremarkable. Stomach/Bowel: Stomach is within normal limits. Appendix appears normal. No evidence of bowel wall thickening, distention, or inflammatory changes. No free air or pneumatosis. Vascular/Lymphatic: No significant vascular findings are present. No enlarged abdominal or pelvic lymph nodes. Reproductive: Uterus and bilateral adnexa are unremarkable. Other: No abdominopelvic ascites. Fat containing umbilical hernia is present. Musculoskeletal: Mild degenerative changes are noted in the thoracolumbar spine. No acute osseous abnormality. IMPRESSION: 1. Mild prominence of the right ureter with periureteral fat stranding which may be infectious or inflammatory. No obstructive uropathy bilaterally.  2. Bilateral nephrolithiasis. Electronically Signed   By: Thornell Sartorius M.D.   On: 05/09/2023 00:52    Procedures Procedures    Medications Ordered in ED Medications  ketorolac (TORADOL) 15 MG/ML injection 15 mg (15 mg Intravenous Given 05/09/23 0103)  ondansetron (ZOFRAN) injection 4 mg (4 mg Intravenous Given 05/09/23 0105)  cefTRIAXone (ROCEPHIN) 1 g in sodium chloride 0.9 % 100 mL IVPB (0 g Intravenous Stopped 05/09/23 0142)    ED Course/ Medical Decision Making/ A&P Clinical Course as of 05/09/23 0154  Tue May 09, 2023  0025 Patient presents with right flank and abdominal pain and appears very uncomfortable.  Strong suspicion for ureteral stone and hydronephrosis.  Urine also consistent with infection, will give IV Rocephin [DW]  0045 Creatinine(!): 1.31 Mild renal insufficiency [DW]  0045 WBC(!): 12.5 leukocytosis [DW]  0153 Overall patient is feeling improved.  No obvious ureteral stones, though she may have had a recently passed kidney stone  Urine and CT is consistent with infection.  However patient is not septic appearing is overall improved, she is safe for outpatient management.  Urine culture sent and she was loaded with Rocephin.  She be placed on Keflex 4 times daily for a week.  Discussed at length strict ER return precautions with patient and her spouse [DW]    Clinical Course User Index [DW] Zadie Rhine, MD  Medical Decision Making Amount and/or Complexity of Data Reviewed Labs: ordered. Decision-making details documented in ED Course. Radiology: ordered.  Risk Prescription drug management.   This patient presents to the ED for concern of abdominal pain/flank pain, this involves an extensive number of treatment options, and is a complaint that carries with it a high risk of complications and morbidity.  The differential diagnosis includes but is not limited to cholecystitis, cholelithiasis, pancreatitis, gastritis, peptic  ulcer disease, appendicitis, bowel obstruction, bowel perforation, diverticulitis, AAA, ischemic bowel, UTI, ureteral stone, ectopic pregnancy    Additional history obtained: Additional history obtained from spouse Records reviewed  outpatient records reviewed  Lab Tests: I Ordered, and personally interpreted labs.  The pertinent results include:  UTI, hematuria, leukocytosis  Imaging Studies ordered: I ordered imaging studies including CT scan renal   I independently visualized and interpreted imaging which showed pyelonephritis I agree with the radiologist interpretation  Medicines ordered and prescription drug management: I ordered medication including IV toradol  for pain  Reevaluation of the patient after these medicines showed that the patient    improved   Critical Interventions:   IV antibiotics  Reevaluation: After the interventions noted above, I reevaluated the patient and found that they have :improved  Complexity of problems addressed: Patient's presentation is most consistent with  acute presentation with potential threat to life or bodily function  Disposition: After consideration of the diagnostic results and the patient's response to treatment,  I feel that the patent would benefit from discharge   .           Final Clinical Impression(s) / ED Diagnoses Final diagnoses:  Pyelonephritis    Rx / DC Orders ED Discharge Orders          Ordered    cephALEXin (KEFLEX) 500 MG capsule  4 times daily        05/09/23 0152    fluconazole (DIFLUCAN) 150 MG tablet  Daily        05/09/23 0152    HYDROcodone-acetaminophen (NORCO/VICODIN) 5-325 MG tablet  Every 6 hours PRN        05/09/23 0152              Zadie Rhine, MD 05/09/23 0154

## 2023-05-11 LAB — URINE CULTURE: Culture: >=100000 — AB

## 2023-05-12 ENCOUNTER — Telehealth (HOSPITAL_BASED_OUTPATIENT_CLINIC_OR_DEPARTMENT_OTHER): Payer: Self-pay

## 2023-05-12 NOTE — Telephone Encounter (Signed)
Post ED Visit - Positive Culture Follow-up: Unsuccessful Patient Follow-up  Culture assessed and recommendations reviewed by:  [x]  Ivery Quale, Pharm.D. []  Celedonio Miyamoto, Pharm.D., BCPS AQ-ID []  Garvin Fila, Pharm.D., BCPS []  Georgina Pillion, Pharm.D., BCPS []  [place], [address].D., BCPS, AAHIVP []  Estella Husk, Pharm.D., BCPS, AAHIVP []  Sherlynn Carbon, PharmD []  Pollyann Samples, PharmD, BCPS  Positive urien culture  [x]  Patient discharged without antimicrobial prescription and treatment is now indicated []  Organism is resistant to prescribed ED discharge antimicrobial []  Patient with positive blood cultures  Plan: Change abx to Ciprofloxacin 500 mg po BID x 5 days per ED provider Kristine Royal, MD   Unable to contact patient after 3 attempts, letter will be sent to address on file  Sandria Senter 05/12/2023, 2:15 PM

## 2023-05-12 NOTE — Progress Notes (Signed)
ED Antimicrobial Stewardship Positive Culture Follow Up   Dominique Davenport is an 52 y.o. female who presented to [facility] on 05/08/2023 with a chief complaint of  Chief Complaint  Patient presents with   Abdominal Pain    Recent Results (from the past 720 hour(s))  Urine Culture     Status: Abnormal   Collection Time: 05/09/23 12:20 AM   Specimen: Urine, Clean Catch  Result Value Ref Range Status   Specimen Description URINE, CLEAN CATCH  Final   Special Requests   Final    NONE Performed at [facility], [address] [place], [place] [address]    Culture >=100,000 COLONIES/mL STAPHYLOCOCCUS SAPROPHYTICUS (A)  Final   Report Status 05/11/2023 FINAL  Final   Organism ID, Bacteria STAPHYLOCOCCUS SAPROPHYTICUS (A)  Final      Susceptibility   Staphylococcus saprophyticus - MIC*    CIPROFLOXACIN <=0.5 SENSITIVE Sensitive     GENTAMICIN <=0.5 SENSITIVE Sensitive     NITROFURANTOIN <=16 SENSITIVE Sensitive     OXACILLIN >=4 RESISTANT Resistant     TETRACYCLINE <=1 SENSITIVE Sensitive     VANCOMYCIN 1 SENSITIVE Sensitive     TRIMETH/SULFA <=10 SENSITIVE Sensitive     CLINDAMYCIN <=0.25 SENSITIVE Sensitive     RIFAMPIN <=0.5 SENSITIVE Sensitive     Inducible Clindamycin NEGATIVE Sensitive     * >=100,000 COLONIES/mL STAPHYLOCOCCUS SAPROPHYTICUS    [x]  Treated with keflex, organism resistant to prescribed antimicrobial []  Patient discharged originally without antimicrobial agent and treatment is now indicated  New antibiotic prescription: ciprofloxacin 500mg  BID x 5 days  ED Provider: Kristine Royal, MD    Marja Kays 05/12/2023, 8:35 AM Clinical Pharmacist [phone]

## 2023-05-23 DIAGNOSIS — R399 Unspecified symptoms and signs involving the genitourinary system: Secondary | ICD-10-CM | POA: Diagnosis not present

## 2023-05-25 ENCOUNTER — Telehealth (HOSPITAL_BASED_OUTPATIENT_CLINIC_OR_DEPARTMENT_OTHER): Payer: Self-pay | Admitting: *Deleted

## 2023-05-25 NOTE — Telephone Encounter (Signed)
Received phone call from pt in response to letter sent to address on file.  States she is continuing to have UTI symptoms.  Ciprofloxacin 500mg  PO BID x 5 days call to [facility], [facility] [phone]

## 2023-06-05 DIAGNOSIS — N390 Urinary tract infection, site not specified: Secondary | ICD-10-CM | POA: Diagnosis not present

## 2023-06-05 DIAGNOSIS — R309 Painful micturition, unspecified: Secondary | ICD-10-CM | POA: Diagnosis not present

## 2023-06-05 DIAGNOSIS — R1030 Lower abdominal pain, unspecified: Secondary | ICD-10-CM | POA: Diagnosis not present

## 2023-06-13 DIAGNOSIS — M549 Dorsalgia, unspecified: Secondary | ICD-10-CM | POA: Insufficient documentation

## 2023-10-09 DIAGNOSIS — R52 Pain, unspecified: Secondary | ICD-10-CM | POA: Diagnosis not present

## 2023-10-09 DIAGNOSIS — R059 Cough, unspecified: Secondary | ICD-10-CM | POA: Diagnosis not present

## 2023-10-09 DIAGNOSIS — R509 Fever, unspecified: Secondary | ICD-10-CM | POA: Diagnosis not present

## 2023-10-09 DIAGNOSIS — J189 Pneumonia, unspecified organism: Secondary | ICD-10-CM | POA: Diagnosis not present

## 2023-10-09 DIAGNOSIS — J101 Influenza due to other identified influenza virus with other respiratory manifestations: Secondary | ICD-10-CM | POA: Diagnosis not present

## 2023-10-13 DIAGNOSIS — J189 Pneumonia, unspecified organism: Secondary | ICD-10-CM | POA: Diagnosis not present

## 2023-12-21 DIAGNOSIS — N950 Postmenopausal bleeding: Secondary | ICD-10-CM | POA: Diagnosis not present

## 2023-12-25 DIAGNOSIS — N950 Postmenopausal bleeding: Secondary | ICD-10-CM | POA: Diagnosis not present

## 2024-02-28 ENCOUNTER — Encounter: Payer: Self-pay | Admitting: Podiatry

## 2024-02-28 ENCOUNTER — Ambulatory Visit: Admitting: Podiatry

## 2024-02-28 ENCOUNTER — Other Ambulatory Visit: Payer: Self-pay | Admitting: Podiatry

## 2024-02-28 ENCOUNTER — Ambulatory Visit (INDEPENDENT_AMBULATORY_CARE_PROVIDER_SITE_OTHER)

## 2024-02-28 DIAGNOSIS — M7751 Other enthesopathy of right foot: Secondary | ICD-10-CM

## 2024-02-28 DIAGNOSIS — S92425A Nondisplaced fracture of distal phalanx of left great toe, initial encounter for closed fracture: Secondary | ICD-10-CM

## 2024-02-28 DIAGNOSIS — S90112A Contusion of left great toe without damage to nail, initial encounter: Secondary | ICD-10-CM | POA: Diagnosis not present

## 2024-02-28 DIAGNOSIS — G5781 Other specified mononeuropathies of right lower limb: Secondary | ICD-10-CM

## 2024-02-28 MED ORDER — TRIAMCINOLONE ACETONIDE 40 MG/ML IJ SUSP
20.0000 mg | Freq: Once | INTRAMUSCULAR | Status: AC
Start: 2024-02-28 — End: 2024-02-28
  Administered 2024-02-28: 20 mg

## 2024-02-28 NOTE — Progress Notes (Signed)
  Subjective:  Patient ID: Dominique Davenport, female    DOB: 14-Jun-1971,  MRN: [id number] HPI Chief Complaint  Patient presents with   Foot Pain    Lateral/forefoot right - aching x few months, no injury   Toe Injury    Hallux left - injury on Saturday (02/24/24), bruised, swollen tip of toe, some pain forefoot   New Patient (Initial Visit)    Est pt 2022    53 y.o. female presents with the above complaint.   ROS: Denies fever chills nausea vomit muscle aches pains calf pain back pain chest pain shortness of breath.  No past medical history on file. No past surgical history on file.  Current Outpatient Medications:    carvedilol (COREG) 12.5 MG tablet, Take 6.25 mg by mouth 2 (two) times daily with a meal., Disp: , Rfl:    levonorgestrel-ethinyl estradiol (SEASONALE) 0.15-0.03 MG tablet, Take 1 tablet by mouth daily., Disp: , Rfl:    Nutritional Supplements (ADULT NUTRITIONAL SUPPLEMENT PO), Take 1 tablet by mouth daily., Disp: , Rfl:    oxybutynin (DITROPAN XL) 15 MG 24 hr tablet, Take 15 mg by mouth daily., Disp: , Rfl:    Probiotic Product (PROBIOTIC PO), Take 1 capsule by mouth daily., Disp: , Rfl:   Allergies  Allergen Reactions   Prednisone     Other reaction(s): Other, Other (See Comments) Decreased immune system    Review of Systems Objective:  There were no vitals filed for this visit.  General: Well developed, nourished, in no acute distress, alert and oriented x3   Dermatological: Skin is warm, dry and supple bilateral. Nails x 10 are well maintained; remaining integument appears unremarkable at this time. There are no open sores, no preulcerative lesions, no rash or signs of infection present.  Vascular: Dorsalis Pedis artery and Posterior Tibial artery pedal pulses are 2/4 bilateral with immedate capillary fill time. Pedal hair growth present. No varicosities and no lower extremity edema present bilateral.   Neruologic: Grossly intact via light touch bilateral.  Vibratory intact via tuning fork bilateral. Protective threshold with Semmes Wienstein monofilament intact to all pedal sites bilateral. Patellar and Achilles deep tendon reflexes 2+ bilateral. No Babinski or clonus noted bilateral.  Palpable Mulder's click third interdigital space of the right foot.  Musculoskeletal: No gross boney pedal deformities bilateral. No pain, crepitus, or limitation noted with foot and ankle range of motion bilateral. Muscular strength 5/5 in all groups tested bilateral.  Swollen ecchymotic hallux left.  Tender on range of motion of the interphalangeal joint of the hallux.  Gait: Unassisted, Nonantalgic.    Radiographs:  Radiographs of the left foot taken today demonstrate osseously mature individual with good bone mineralization.  It appears that she has a transverse fracture just deep to the subchondral bone of the distal phalanx.  Does not appear to be comminuted or displaced at this point.  Right foot demonstrates a retained screw first metatarsal secondary to an osteotomy and correction.  No other osseous abnormalities.  Assessment & Plan:   Assessment: Fracture hallux left distal phalanx transverse subchondral bone.  Neuroma third interdigital space right.  Plan: Discussed etiology pathology conservative surgical therapies recommended Darco shoe for the left foot and the fracture of the hallux.  I injected the third indurative space today 20 mg Kenalog , Marcaine plain maximal tenderness.  Tolerated procedure well without complications.  Follow-up with her in the near future.     Sandralee Tarkington T. Call[place]han, [place]

## 2024-03-26 DIAGNOSIS — N950 Postmenopausal bleeding: Secondary | ICD-10-CM | POA: Diagnosis not present

## 2024-03-27 ENCOUNTER — Ambulatory Visit (INDEPENDENT_AMBULATORY_CARE_PROVIDER_SITE_OTHER)

## 2024-03-27 ENCOUNTER — Ambulatory Visit: Admitting: Podiatry

## 2024-03-27 DIAGNOSIS — S92425A Nondisplaced fracture of distal phalanx of left great toe, initial encounter for closed fracture: Secondary | ICD-10-CM

## 2024-03-27 DIAGNOSIS — G5781 Other specified mononeuropathies of right lower limb: Secondary | ICD-10-CM

## 2024-03-27 DIAGNOSIS — S92425D Nondisplaced fracture of distal phalanx of left great toe, subsequent encounter for fracture with routine healing: Secondary | ICD-10-CM

## 2024-03-27 NOTE — Progress Notes (Signed)
 She presents today for a follow-up of her fracture hallux left.  She states that it is still tender continues to wear her Darco shoe.  States that it is feeling some better.  She states that the neuroma in the right foot is feeling much better  Objective: 4-week follow-up fracture base distal phalanx subchondral bone nondisplaced left foot.  Moderately tender on palpation much decrease in edema.  Tender on range of motion at the interphalangeal joint.  No pain on palpation neuroma third interdigital space right foot  Radiographs taken today demonstrate healing fracture base of distal phalanx interphalangeal joint of the left hallux  Assessment: Well-healing fracture.  Resolving neuroma third interspace right foot  Plan: Continue the use of the Darco shoe for another 4 weeks.  We did discuss long-term effects of this type of fracture floating arthritis and chronic discomfort.

## 2024-03-28 NOTE — Addendum Note (Signed)
 Addended by: ELAYNE ROSINA BRAVO on: 03/28/2024 07:54 PM   Modules accepted: Level of Service

## 2024-04-02 DIAGNOSIS — N3941 Urge incontinence: Secondary | ICD-10-CM | POA: Diagnosis not present

## 2024-04-02 DIAGNOSIS — N760 Acute vaginitis: Secondary | ICD-10-CM | POA: Diagnosis not present

## 2024-04-02 DIAGNOSIS — N950 Postmenopausal bleeding: Secondary | ICD-10-CM | POA: Diagnosis not present

## 2024-04-02 DIAGNOSIS — Z1231 Encounter for screening mammogram for malignant neoplasm of breast: Secondary | ICD-10-CM | POA: Diagnosis not present

## 2024-04-02 DIAGNOSIS — Z01411 Encounter for gynecological examination (general) (routine) with abnormal findings: Secondary | ICD-10-CM | POA: Diagnosis not present

## 2024-05-17 DIAGNOSIS — H8109 Meniere's disease, unspecified ear: Secondary | ICD-10-CM | POA: Diagnosis not present

## 2024-05-17 DIAGNOSIS — H9313 Tinnitus, bilateral: Secondary | ICD-10-CM | POA: Diagnosis not present

## 2024-05-17 DIAGNOSIS — H903 Sensorineural hearing loss, bilateral: Secondary | ICD-10-CM | POA: Diagnosis not present

## 2024-05-20 DIAGNOSIS — R202 Paresthesia of skin: Secondary | ICD-10-CM | POA: Diagnosis not present

## 2024-06-26 DIAGNOSIS — M25552 Pain in left hip: Secondary | ICD-10-CM | POA: Diagnosis not present

## 2024-06-26 DIAGNOSIS — M47816 Spondylosis without myelopathy or radiculopathy, lumbar region: Secondary | ICD-10-CM | POA: Diagnosis not present

## 2024-06-26 DIAGNOSIS — M549 Dorsalgia, unspecified: Secondary | ICD-10-CM | POA: Diagnosis not present

## 2024-07-01 ENCOUNTER — Encounter (HOSPITAL_BASED_OUTPATIENT_CLINIC_OR_DEPARTMENT_OTHER): Payer: Self-pay | Admitting: Obstetrics & Gynecology

## 2024-07-01 DIAGNOSIS — M546 Pain in thoracic spine: Secondary | ICD-10-CM | POA: Diagnosis not present

## 2024-07-01 DIAGNOSIS — M25552 Pain in left hip: Secondary | ICD-10-CM | POA: Diagnosis not present

## 2024-07-01 DIAGNOSIS — R293 Abnormal posture: Secondary | ICD-10-CM | POA: Diagnosis not present

## 2024-07-04 ENCOUNTER — Ambulatory Visit (HOSPITAL_BASED_OUTPATIENT_CLINIC_OR_DEPARTMENT_OTHER): Admitting: Obstetrics & Gynecology

## 2024-07-04 ENCOUNTER — Other Ambulatory Visit (HOSPITAL_COMMUNITY)
Admission: RE | Admit: 2024-07-04 | Discharge: 2024-07-04 | Disposition: A | Discharge status: Home / Self Care | Source: Ambulatory Visit | Attending: Obstetrics & Gynecology | Admitting: Obstetrics & Gynecology

## 2024-07-04 ENCOUNTER — Encounter (HOSPITAL_BASED_OUTPATIENT_CLINIC_OR_DEPARTMENT_OTHER): Payer: Self-pay | Admitting: Obstetrics & Gynecology

## 2024-07-04 VITALS — BP 152/72 | HR 61 | Ht 62.0 in | Wt 188.6 lb

## 2024-07-04 DIAGNOSIS — N898 Other specified noninflammatory disorders of vagina: Secondary | ICD-10-CM | POA: Diagnosis not present

## 2024-07-04 DIAGNOSIS — Z1331 Encounter for screening for depression: Secondary | ICD-10-CM | POA: Diagnosis not present

## 2024-07-04 DIAGNOSIS — Z1211 Encounter for screening for malignant neoplasm of colon: Secondary | ICD-10-CM

## 2024-07-04 DIAGNOSIS — N941 Unspecified dyspareunia: Secondary | ICD-10-CM | POA: Diagnosis not present

## 2024-07-04 MED ORDER — ESTRADIOL 0.01 % VA CREA
TOPICAL_CREAM | VAGINAL | 1 refills | Status: AC
Start: 2024-07-04 — End: ?

## 2024-07-04 NOTE — Progress Notes (Unsigned)
 ANNUAL EXAM Patient name: Dominique Davenport MRN [id number]  Date of birth: 11-Aug-1971 Chief Complaint:   Annual Exam  History of Present Illness:   Dominique Davenport is a 53 y.o. [phone] Caucasian female being seen today for new patient appointment.  Has been seen by another ob/gyn in [place] for several years.  Has been advised she is in menopause with blood work that confirmed this finding.  However, had two episodes of bleeding this past year, like menstrual cycles.  For evaluation, underwent ultrasound and endometrial biopsy.  86 pages of records scanned into EPIC and are under the media tab today.  Prior pap smear and endometrial biopsy found in these records.  Biopsy don e 12/27/2023 showed atrophic changes.  Since biopsy, she's been experiencing dyspareunia especially with insertion.  Shared concerns with prior ob/gyn and advised she had vaginitis contributing . Treatment did not help.  No recent vaginal bleeding.    Patient's last menstrual period was 04/08/2024 (approximate).  Last pap 03/2024. Results were: NILM w/ HRHPV negative. H/O abnormal pap: no Last mammogram: 04/02/2024. Results were: normal. Family h/o breast cancer: yes grandmother breast cancer noted at her death at age 20 Last colonoscopy: never     07/04/2024    2:23 PM  Depression screen PHQ 2/9  Decreased Interest 1  Down, Depressed, Hopeless 0  PHQ - 2 Score 1        07/04/2024    2:23 PM  GAD 7 : Generalized Anxiety Score  Nervous, Anxious, on Edge 0  Control/stop worrying 0  Worry too much - different things 0  Trouble relaxing 1  Restless 0  Easily annoyed or irritable 0  Afraid - awful might happen 1  Total GAD 7 Score 2  Anxiety Difficulty Not difficult at all     Review of Systems:   Pertinent items are noted in HPI Denies any urinary or bowel changes Pertinent History Reviewed:  Reviewed past medical,surgical, social and family history.  Reviewed problem list, medications and  allergies. Physical Assessment:   Vitals:   07/04/24 1358  BP: (!) 152/72  Pulse: 61  SpO2: 100%  Weight: 188 lb 9.6 oz (85.5 kg)  Height: 5' 2 (1.575 m)  Body mass index is 34.5 kg/m.        Physical Examination:   General appearance - well appearing, and in no distress  Mental status - alert, oriented to person, place, and time  Neck:  midline trachea, no thyromegaly or nodules  Breasts - breasts appear normal, no suspicious masses, no skin or nipple changes or  axillary nodes  Abdomen - soft, nontender, nondistended, no masses or organomegaly  Pelvic - VULVA: normal appearing vulva with no masses, tenderness or lesions  VAGINA: erythematous appearing mucosa, no lesions, minimal discharge   CERVIX: normal appearing cervix without discharge or lesions, no CMT  Thin prep pap is not obtained today  UTERUS: uterus is felt to be normal size, shape, consistency and nontender   ADNEXA: No adnexal masses or tenderness noted.   Chaperone present for exam  No results found for this or any previous visit (from the past 24 hours).  Assessment & Plan:  1. Dyspareunia in female (Primary) - we discussed this new symptoms does seem to be linked to endometrial biopsy but physical exam c/w decreased estrogen changes so would recommend trail of vaginal estrogen cream.  Length of time for improvement and dosing discussed.  Will plan to recheck in 3 months.  If no  improvement, will plan to repeat u/s to look for any structural findings.   - estradiol (ESTRACE) 0.01 % CREA vaginal cream; 1 gram vaginally twice weekly  Dispense: 42.5 g; Refill: 1  2. Vaginal irritation - will r/o vaginitis as cause of symptoms - Cervicovaginal ancillary only( [place])  3. Colon cancer screening - needs to have colonoscopy done.  Referral placed.   - Ambulatory referral to Gastroenterology  Follow-up: Return in about 3 months (around 10/04/2024).  Ronal GORMAN Pinal, MD 07/05/2024 6:05 AM

## 2024-07-05 DIAGNOSIS — N941 Unspecified dyspareunia: Secondary | ICD-10-CM | POA: Insufficient documentation

## 2024-07-05 LAB — CERVICOVAGINAL ANCILLARY ONLY
Bacterial Vaginitis (gardnerella): NEGATIVE
Candida Glabrata: NEGATIVE
Candida Vaginitis: NEGATIVE
Comment: NEGATIVE
Comment: NEGATIVE
Comment: NEGATIVE

## 2024-07-07 ENCOUNTER — Ambulatory Visit (HOSPITAL_BASED_OUTPATIENT_CLINIC_OR_DEPARTMENT_OTHER): Payer: Self-pay | Admitting: Obstetrics & Gynecology

## 2024-07-08 DIAGNOSIS — M25552 Pain in left hip: Secondary | ICD-10-CM | POA: Diagnosis not present

## 2024-07-08 DIAGNOSIS — M546 Pain in thoracic spine: Secondary | ICD-10-CM | POA: Diagnosis not present

## 2024-07-08 DIAGNOSIS — R293 Abnormal posture: Secondary | ICD-10-CM | POA: Diagnosis not present

## 2024-07-12 DIAGNOSIS — M546 Pain in thoracic spine: Secondary | ICD-10-CM | POA: Diagnosis not present

## 2024-07-12 DIAGNOSIS — R293 Abnormal posture: Secondary | ICD-10-CM | POA: Diagnosis not present

## 2024-07-12 DIAGNOSIS — M25552 Pain in left hip: Secondary | ICD-10-CM | POA: Diagnosis not present

## 2024-07-15 DIAGNOSIS — M25552 Pain in left hip: Secondary | ICD-10-CM | POA: Diagnosis not present

## 2024-07-15 DIAGNOSIS — M546 Pain in thoracic spine: Secondary | ICD-10-CM | POA: Diagnosis not present

## 2024-07-15 DIAGNOSIS — R293 Abnormal posture: Secondary | ICD-10-CM | POA: Diagnosis not present

## 2024-07-18 DIAGNOSIS — R293 Abnormal posture: Secondary | ICD-10-CM | POA: Diagnosis not present

## 2024-07-18 DIAGNOSIS — M25552 Pain in left hip: Secondary | ICD-10-CM | POA: Diagnosis not present

## 2024-07-18 DIAGNOSIS — M546 Pain in thoracic spine: Secondary | ICD-10-CM | POA: Diagnosis not present

## 2024-07-22 ENCOUNTER — Telehealth: Payer: Self-pay | Admitting: Podiatry

## 2024-07-22 DIAGNOSIS — R293 Abnormal posture: Secondary | ICD-10-CM | POA: Diagnosis not present

## 2024-07-22 DIAGNOSIS — M546 Pain in thoracic spine: Secondary | ICD-10-CM | POA: Diagnosis not present

## 2024-07-22 DIAGNOSIS — M25552 Pain in left hip: Secondary | ICD-10-CM | POA: Diagnosis not present

## 2024-07-22 NOTE — Telephone Encounter (Signed)
 Called and left a voicemail. Patient scheduled via MyChart as a new patient. Needs to be scheduled as established.

## 2024-07-25 ENCOUNTER — Ambulatory Visit: Admitting: Podiatry

## 2024-07-25 ENCOUNTER — Ambulatory Visit

## 2024-07-25 DIAGNOSIS — G5762 Lesion of plantar nerve, left lower limb: Secondary | ICD-10-CM | POA: Diagnosis not present

## 2024-07-25 DIAGNOSIS — M7742 Metatarsalgia, left foot: Secondary | ICD-10-CM | POA: Diagnosis not present

## 2024-07-25 DIAGNOSIS — G5761 Lesion of plantar nerve, right lower limb: Secondary | ICD-10-CM | POA: Diagnosis not present

## 2024-07-25 DIAGNOSIS — M7741 Metatarsalgia, right foot: Secondary | ICD-10-CM | POA: Diagnosis not present

## 2024-07-25 DIAGNOSIS — M7740 Metatarsalgia, unspecified foot: Secondary | ICD-10-CM

## 2024-07-25 MED ORDER — TRIAMCINOLONE ACETONIDE 40 MG/ML IJ SUSP
40.0000 mg | Freq: Once | INTRAMUSCULAR | Status: AC
Start: 2024-07-25 — End: 2024-07-25
  Administered 2024-07-25: 40 mg

## 2024-07-26 NOTE — Progress Notes (Signed)
 She presents today after having not seen her since July with a chief concern of bilateral foot pain down the lateral aspect of the foot around the 4th and 5th metatarsals.  Objective: Vital signs are stable alert oriented x 3.  Pulses are palpable.  She has moderate tenderness on palpation of the fourth intermetatarsal space distally with exquisite tenderness and palpable Mulder's click.  Radiographs taken today do not demonstrate any significant osseous abnormalities in these areas though she does have a retained screw from the capital osteotomy first metatarsal of the left foot.  Assessment probable neuroma fourth interdigital space.  Plan: I injected these areas today with Kenalog  and local anesthetic.  Tolerated procedure well without complications will follow-up with her as needed.  We did discuss the possible need for dehydrated alcohol injections.

## 2024-07-29 DIAGNOSIS — M25552 Pain in left hip: Secondary | ICD-10-CM | POA: Diagnosis not present

## 2024-07-29 DIAGNOSIS — M546 Pain in thoracic spine: Secondary | ICD-10-CM | POA: Diagnosis not present

## 2024-07-29 DIAGNOSIS — R293 Abnormal posture: Secondary | ICD-10-CM | POA: Diagnosis not present

## 2024-08-01 DIAGNOSIS — M546 Pain in thoracic spine: Secondary | ICD-10-CM | POA: Diagnosis not present

## 2024-08-01 DIAGNOSIS — M25552 Pain in left hip: Secondary | ICD-10-CM | POA: Diagnosis not present

## 2024-08-01 DIAGNOSIS — R293 Abnormal posture: Secondary | ICD-10-CM | POA: Diagnosis not present

## 2024-08-06 DIAGNOSIS — M546 Pain in thoracic spine: Secondary | ICD-10-CM | POA: Diagnosis not present

## 2024-08-06 DIAGNOSIS — M25552 Pain in left hip: Secondary | ICD-10-CM | POA: Diagnosis not present

## 2024-08-06 DIAGNOSIS — M47816 Spondylosis without myelopathy or radiculopathy, lumbar region: Secondary | ICD-10-CM | POA: Diagnosis not present

## 2024-08-09 DIAGNOSIS — R293 Abnormal posture: Secondary | ICD-10-CM | POA: Diagnosis not present

## 2024-08-09 DIAGNOSIS — M546 Pain in thoracic spine: Secondary | ICD-10-CM | POA: Diagnosis not present

## 2024-08-09 DIAGNOSIS — M25552 Pain in left hip: Secondary | ICD-10-CM | POA: Diagnosis not present

## 2024-08-12 DIAGNOSIS — M546 Pain in thoracic spine: Secondary | ICD-10-CM | POA: Diagnosis not present

## 2024-08-12 DIAGNOSIS — M25552 Pain in left hip: Secondary | ICD-10-CM | POA: Diagnosis not present

## 2024-08-12 DIAGNOSIS — R293 Abnormal posture: Secondary | ICD-10-CM | POA: Diagnosis not present

## 2024-08-15 DIAGNOSIS — R293 Abnormal posture: Secondary | ICD-10-CM | POA: Diagnosis not present

## 2024-08-15 DIAGNOSIS — M25552 Pain in left hip: Secondary | ICD-10-CM | POA: Diagnosis not present

## 2024-08-15 DIAGNOSIS — M546 Pain in thoracic spine: Secondary | ICD-10-CM | POA: Diagnosis not present

## 2024-08-22 DIAGNOSIS — M546 Pain in thoracic spine: Secondary | ICD-10-CM | POA: Diagnosis not present

## 2024-08-22 DIAGNOSIS — R293 Abnormal posture: Secondary | ICD-10-CM | POA: Diagnosis not present

## 2024-08-22 DIAGNOSIS — M25552 Pain in left hip: Secondary | ICD-10-CM | POA: Diagnosis not present

## 2024-08-26 DIAGNOSIS — M549 Dorsalgia, unspecified: Secondary | ICD-10-CM | POA: Diagnosis not present

## 2024-08-26 DIAGNOSIS — M5459 Other low back pain: Secondary | ICD-10-CM | POA: Diagnosis not present

## 2024-08-28 DIAGNOSIS — M546 Pain in thoracic spine: Secondary | ICD-10-CM | POA: Diagnosis not present

## 2024-08-28 DIAGNOSIS — R293 Abnormal posture: Secondary | ICD-10-CM | POA: Diagnosis not present

## 2024-08-28 DIAGNOSIS — M25552 Pain in left hip: Secondary | ICD-10-CM | POA: Diagnosis not present

## 2024-08-29 DIAGNOSIS — R051 Acute cough: Secondary | ICD-10-CM | POA: Diagnosis not present

## 2024-08-29 DIAGNOSIS — R062 Wheezing: Secondary | ICD-10-CM | POA: Diagnosis not present

## 2024-08-29 DIAGNOSIS — J069 Acute upper respiratory infection, unspecified: Secondary | ICD-10-CM | POA: Diagnosis not present

## 2024-08-30 DIAGNOSIS — M47816 Spondylosis without myelopathy or radiculopathy, lumbar region: Secondary | ICD-10-CM | POA: Diagnosis not present

## 2024-10-04 ENCOUNTER — Ambulatory Visit (HOSPITAL_BASED_OUTPATIENT_CLINIC_OR_DEPARTMENT_OTHER): Admitting: Obstetrics & Gynecology
